# Patient Record
Sex: Male | Born: 1975 | Race: Black or African American | Hispanic: No | Marital: Single | Smoking: Never smoker
Health system: Southern US, Community
[De-identification: ages and names within clinical notes are randomized; demographics above are authoritative.]

## PROBLEM LIST (undated history)

## (undated) DIAGNOSIS — I10 Essential (primary) hypertension: Secondary | ICD-10-CM

## (undated) HISTORY — PX: HAND SURGERY: SHX662

---

## 1999-06-20 ENCOUNTER — Emergency Department (HOSPITAL_COMMUNITY): Admission: EM | Admit: 1999-06-20 | Discharge: 1999-06-20 | Payer: Self-pay | Admitting: Emergency Medicine

## 1999-07-02 ENCOUNTER — Encounter: Admission: RE | Admit: 1999-07-02 | Discharge: 1999-07-02 | Payer: Self-pay | Admitting: *Deleted

## 2002-06-15 ENCOUNTER — Emergency Department (HOSPITAL_COMMUNITY): Admission: EM | Admit: 2002-06-15 | Discharge: 2002-06-15 | Payer: Self-pay | Admitting: Emergency Medicine

## 2002-06-15 ENCOUNTER — Encounter: Payer: Self-pay | Admitting: Emergency Medicine

## 2005-11-13 ENCOUNTER — Emergency Department (HOSPITAL_COMMUNITY): Admission: EM | Admit: 2005-11-13 | Discharge: 2005-11-13 | Payer: Self-pay | Admitting: Emergency Medicine
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
## 2011-10-25 ENCOUNTER — Ambulatory Visit: Payer: Worker's Compensation

## 2017-10-06 ENCOUNTER — Encounter (HOSPITAL_COMMUNITY): Payer: Self-pay | Admitting: Emergency Medicine

## 2017-10-06 ENCOUNTER — Ambulatory Visit (HOSPITAL_COMMUNITY)
Admission: EM | Admit: 2017-10-06 | Discharge: 2017-10-06 | Disposition: A | Discharge status: Home / Self Care | Payer: Self-pay | Attending: Emergency Medicine | Admitting: Emergency Medicine

## 2017-10-06 DIAGNOSIS — M542 Cervicalgia: Secondary | ICD-10-CM

## 2017-10-06 DIAGNOSIS — S161XXA Strain of muscle, fascia and tendon at neck level, initial encounter: Secondary | ICD-10-CM

## 2017-10-06 DIAGNOSIS — M5489 Other dorsalgia: Secondary | ICD-10-CM

## 2017-10-06 MED ORDER — CYCLOBENZAPRINE HCL 10 MG PO TABS: 10.0000 mg | ORAL_TABLET | Freq: Two times a day (BID) | ORAL | 0 refills | Status: AC | PRN

## 2017-10-06 NOTE — Discharge Instructions (Addendum)
Your pain is likely from a muscle strain, take flexeril (muscle relaxer) and ibuprofen/aleeve as needed.   Check your BP tomorrow at work in AM and Pm for this week. If pressure continues to be elevated >140/90 please return.  If you start to feel chest pain, shortness of breath, lightheadedness, worsening headache, or vision changes, blood in urine, decreased urine, ripping tearing pain to backgo to the Ed.
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
## 2019-11-29 ENCOUNTER — Other Ambulatory Visit: Payer: Self-pay | Admitting: Cardiology

## 2019-11-29 DIAGNOSIS — Z20822 Contact with and (suspected) exposure to covid-19: Secondary | ICD-10-CM

## 2019-11-30 LAB — NOVEL CORONAVIRUS, NAA: SARS-CoV-2, NAA: NOT DETECTED

## 2024-08-12 ENCOUNTER — Ambulatory Visit
Admission: EM | Admit: 2024-08-12 | Discharge: 2024-08-12 | Disposition: A | Discharge status: Home / Self Care | Payer: Worker's Compensation | Attending: Family Medicine | Admitting: Family Medicine

## 2024-08-12 ENCOUNTER — Other Ambulatory Visit: Payer: Self-pay

## 2024-08-12 DIAGNOSIS — Y990 Civilian activity done for income or pay: Secondary | ICD-10-CM | POA: Diagnosis not present

## 2024-08-12 DIAGNOSIS — S0990XA Unspecified injury of head, initial encounter: Secondary | ICD-10-CM

## 2024-08-12 DIAGNOSIS — S0001XA Abrasion of scalp, initial encounter: Secondary | ICD-10-CM | POA: Diagnosis not present

## 2024-08-12 HISTORY — DX: Essential (primary) hypertension: I10

## 2024-08-12 MED ORDER — CEPHALEXIN 500 MG PO CAPS: 500.0000 mg | ORAL_CAPSULE | Freq: Three times a day (TID) | ORAL | 0 refills | Status: AC

## 2024-08-12 NOTE — ED Provider Notes (Signed)
 [facility]    CSN: [id number] Arrival date & time: 08/12/24  1831      History   Chief Complaint No chief complaint on file.   HPI Sean Velazquez is a 48 y.o. male presents for work-related injury.  Patient reports today he was at work when he stood up and hit the right top of his head on a metal bar.  This was witnessed by other employees and patient denies LOC.  He states he sustained a wound to the top of the head but has been able to control the bleeding.  He denies any headaches, visual changes, dizziness, nausea/vomiting.  He is not on blood thinning medications.  He states he is up-to-date on his tetanus.  No other concerns at this time  HPI  Past Medical History:  Diagnosis Date   Hypertension     There are no active problems to display for this patient.   Past Surgical History:  Procedure Laterality Date   HAND SURGERY Left        Home Medications    Prior to Admission medications   Medication Sig Start Date End Date Taking? Authorizing Provider  cephALEXin  (KEFLEX ) 500 MG capsule Take 1 capsule (500 mg total) by mouth 3 (three) times daily for 5 days. 08/12/24 08/17/24 Yes Kaileigh Viswanathan, Jodi R, NP  hydrochlorothiazide (HYDRODIURIL) 25 MG tablet Take 25 mg by mouth daily.    [provider]    Family History History reviewed. No pertinent family history.  Social History Social History   Tobacco Use   Smoking status: Never   Smokeless tobacco: Never  Substance Use Topics   Alcohol use: Yes    Alcohol/week: 24.0 standard drinks of alcohol    Types: 24 Cans of beer per week   Drug use: No     Allergies   Patient has no known allergies.   Review of Systems Review of Systems  Skin:  Positive for wound.     Physical Exam Triage Vital Signs ED Triage Vitals  Encounter Vitals Group     BP 08/12/24 1846 133/86     Girls Systolic BP Percentile --      Girls Diastolic BP Percentile --      Boys Systolic BP Percentile --      Boys  Diastolic BP Percentile --      Pulse Rate 08/12/24 1846 70     Resp 08/12/24 1846 17     Temp 08/12/24 1846 98.4 F (36.9 C)     Temp Source 08/12/24 1846 Oral     SpO2 08/12/24 1846 95 %     Weight --      Height --      Head Circumference --      Peak Flow --      Pain Score 08/12/24 1845 0     Pain Loc --      Pain Education --      Exclude from Growth Chart --    No data found.  Updated Vital Signs BP 133/86   Pulse 70   Temp 98.4 F (36.9 C) (Oral)   Resp 17   SpO2 95%   Visual Acuity Right Eye Distance:   Left Eye Distance:   Bilateral Distance:    Right Eye Near:   Left Eye Near:    Bilateral Near:     Physical Exam Vitals and nursing note reviewed.  Constitutional:      General: He is not in acute distress.  Appearance: Normal appearance. He is not ill-appearing.  HENT:     Head: Normocephalic.      Comments: There is a small superficial abrasion to the right front aspect of the scalp.  Bleeding is controlled.  There is no laceration or deep wound.  Mildly tender to palpation.  No swelling. Eyes:     Extraocular Movements: Extraocular movements intact.     Conjunctiva/sclera: Conjunctivae normal.     Pupils: Pupils are equal, round, and reactive to light.  Cardiovascular:     Rate and Rhythm: Normal rate.  Pulmonary:     Effort: Pulmonary effort is normal.  Skin:    General: Skin is warm and dry.  Neurological:     General: No focal deficit present.     Mental Status: He is alert and oriented to person, place, and time.  Psychiatric:        Mood and Affect: Mood normal.        Behavior: Behavior normal.      UC Treatments / Results  Labs (all labs ordered are listed, but only abnormal results are displayed) Labs Reviewed - No data to display  EKG   Radiology No results found.  Procedures Procedures (including critical care time)  Medications Ordered in UC Medications - No data to display  Initial Impression / Assessment and  Plan / UC Course  I have reviewed the triage vital signs and the nursing notes.  Pertinent labs & imaging results that were available during my care of the patient were reviewed by me and considered in my medical decision making (see chart for details).     I reviewed exam and symptoms with patient.  Superficial abrasion to top of head.  No indication for closure but wound was thoroughly cleaned by nursing staff.  Will start Keflex  3 times a day for 5 days to prevent infection.  He is already up-to-date on his tetanus.  Discussed wound care.  Will have him follow-up with the Worker's Comp. clinic in 2 to 3 days for recheck.  Strict ER precautions were reviewed and he verbalized understanding Final Clinical Impressions(s) / UC Diagnoses   Final diagnoses:  Abrasion of scalp, initial encounter  Injury of head, initial encounter  Work related injury     Discharge Instructions      Please keep the wound clean and dry.  Start Keflex  3 times a day for 5 days to prevent infection.  Please follow-up with her Worker's Comp. clinic in 2 to 3 days for recheck.  Please go to the ER if you develop any worsening symptoms such as headaches, nausea/vomiting, dizziness, visual changes, lethargy, or any new concerns that arise.  I hope you feel better soon exclamation     ED Prescriptions     Medication Sig Dispense Auth. Provider   cephALEXin  (KEFLEX ) 500 MG capsule Take 1 capsule (500 mg total) by mouth 3 (three) times daily for 5 days. 15 capsule Giana Castner, Jodi R, NP      PDMP not reviewed this encounter.   Loreda Myla SAUNDERS, NP 08/12/24 [phone]

## 2024-08-12 NOTE — ED Triage Notes (Signed)
 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 Pt has an abrasion on top left side of head. Bleeding is controlled. PT denies LOC. Pt denies blood thinners. Pt denies HA or dizziness.

## 2024-08-12 NOTE — Discharge Instructions (Signed)
 Please keep the wound clean and dry.  Start Keflex  3 times a day for 5 days to prevent infection.  Please follow-up with her Worker's Comp. clinic in 2 to 3 days for recheck.  Please go to the ER if you develop any worsening symptoms such as headaches, nausea/vomiting, dizziness, visual changes, lethargy, or any new concerns that arise.  I hope you feel better soon exclamation
